# Patient Record
Sex: Female | Born: 1995 | Race: White | Hispanic: No | Marital: Single
Health system: Southern US, Community
[De-identification: ages and names within clinical notes are randomized; demographics above are authoritative.]

---
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
## 2017-04-13 ENCOUNTER — Emergency Department (HOSPITAL_COMMUNITY): Payer: 59

## 2017-04-13 ENCOUNTER — Emergency Department (HOSPITAL_COMMUNITY)
Admission: EM | Admit: 2017-04-13 | Discharge: 2017-04-13 | Disposition: A | Discharge status: Home / Self Care | Payer: 59 | Attending: Emergency Medicine | Admitting: Emergency Medicine

## 2017-04-13 ENCOUNTER — Encounter (HOSPITAL_COMMUNITY): Payer: Self-pay | Admitting: Emergency Medicine

## 2017-04-13 DIAGNOSIS — R413 Other amnesia: Secondary | ICD-10-CM | POA: Diagnosis not present

## 2017-04-13 DIAGNOSIS — Y9389 Activity, other specified: Secondary | ICD-10-CM | POA: Insufficient documentation

## 2017-04-13 DIAGNOSIS — S199XXA Unspecified injury of neck, initial encounter: Secondary | ICD-10-CM | POA: Diagnosis not present

## 2017-04-13 DIAGNOSIS — S060X9A Concussion with loss of consciousness of unspecified duration, initial encounter: Secondary | ICD-10-CM | POA: Diagnosis not present

## 2017-04-13 DIAGNOSIS — R51 Headache: Secondary | ICD-10-CM | POA: Diagnosis not present

## 2017-04-13 DIAGNOSIS — S299XXA Unspecified injury of thorax, initial encounter: Secondary | ICD-10-CM | POA: Diagnosis not present

## 2017-04-13 DIAGNOSIS — Y92410 Unspecified street and highway as the place of occurrence of the external cause: Secondary | ICD-10-CM | POA: Diagnosis not present

## 2017-04-13 DIAGNOSIS — S80212A Abrasion, left knee, initial encounter: Secondary | ICD-10-CM | POA: Diagnosis not present

## 2017-04-13 DIAGNOSIS — S0990XA Unspecified injury of head, initial encounter: Secondary | ICD-10-CM | POA: Diagnosis not present

## 2017-04-13 DIAGNOSIS — S80211A Abrasion, right knee, initial encounter: Secondary | ICD-10-CM | POA: Diagnosis not present

## 2017-04-13 DIAGNOSIS — S0190XA Unspecified open wound of unspecified part of head, initial encounter: Secondary | ICD-10-CM | POA: Diagnosis not present

## 2017-04-13 DIAGNOSIS — Y999 Unspecified external cause status: Secondary | ICD-10-CM | POA: Diagnosis not present

## 2017-04-13 MED ORDER — ONDANSETRON 4 MG PO TBDP
8.0000 mg | ORAL_TABLET | Freq: Once | ORAL | Status: AC
Start: 2017-04-13 — End: 2017-04-13
  Administered 2017-04-13: 8 mg via ORAL
  Filled 2017-04-13: qty 2

## 2017-04-13 MED ORDER — ACETAMINOPHEN 325 MG PO TABS
650.0000 mg | ORAL_TABLET | Freq: Once | ORAL | Status: AC
  Administered 2017-04-13: 650 mg via ORAL
  Filled 2017-04-13: qty 2
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
## 2017-04-13 NOTE — ED Notes (Signed)
Patient transported to CT 

## 2017-04-13 NOTE — Discharge Instructions (Signed)
Tylenol or motrin for headache. Avoid strenuous activity. Follow up with family doctor or neurology. Return if worsening headache, vomiting, blurred vision, confusion, memory loss

## 2017-04-15 DIAGNOSIS — H532 Diplopia: Secondary | ICD-10-CM | POA: Diagnosis not present

## 2017-04-15 DIAGNOSIS — R0781 Pleurodynia: Secondary | ICD-10-CM | POA: Diagnosis not present
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
